# Patient Record
Sex: Male | Born: 1980 | Race: Black or African American | Hispanic: No | Marital: Single | Smoking: Never smoker
Health system: Southern US, Community
[De-identification: ages and names within clinical notes are randomized; demographics above are authoritative.]

## PROBLEM LIST (undated history)

## (undated) ENCOUNTER — Ambulatory Visit (HOSPITAL_COMMUNITY): Discharge status: Absent without leave | Payer: BLUE CROSS/BLUE SHIELD

---

## 2017-06-21 ENCOUNTER — Ambulatory Visit (INDEPENDENT_AMBULATORY_CARE_PROVIDER_SITE_OTHER): Payer: BLUE CROSS/BLUE SHIELD

## 2017-06-21 ENCOUNTER — Ambulatory Visit (HOSPITAL_COMMUNITY)
Admission: EM | Admit: 2017-06-21 | Discharge: 2017-06-21 | Disposition: A | Discharge status: Home / Self Care | Payer: BLUE CROSS/BLUE SHIELD | Attending: Family Medicine | Admitting: Family Medicine

## 2017-06-21 ENCOUNTER — Encounter (HOSPITAL_COMMUNITY): Payer: Self-pay | Admitting: Family Medicine

## 2017-06-21 DIAGNOSIS — Z23 Encounter for immunization: Secondary | ICD-10-CM | POA: Diagnosis not present

## 2017-06-21 DIAGNOSIS — S8392XA Sprain of unspecified site of left knee, initial encounter: Secondary | ICD-10-CM | POA: Diagnosis not present

## 2017-06-21 DIAGNOSIS — S90512A Abrasion, left ankle, initial encounter: Secondary | ICD-10-CM

## 2017-06-21 DIAGNOSIS — S93402A Sprain of unspecified ligament of left ankle, initial encounter: Secondary | ICD-10-CM

## 2017-06-21 DIAGNOSIS — S80212A Abrasion, left knee, initial encounter: Secondary | ICD-10-CM

## 2017-06-21 MED ORDER — NAPROXEN 500 MG PO TABS
500.0000 mg | ORAL_TABLET | Freq: Two times a day (BID) | ORAL | 0 refills | Status: AC
Start: 2017-06-21 — End: ?

## 2017-06-21 MED ORDER — TETANUS-DIPHTH-ACELL PERTUSSIS 5-2.5-18.5 LF-MCG/0.5 IM SUSP
0.5000 mL | Freq: Once | INTRAMUSCULAR | Status: AC
  Administered 2017-06-21: 0.5 mL via INTRAMUSCULAR

## 2017-06-21 MED ORDER — HYDROCODONE-ACETAMINOPHEN 5-325 MG PO TABS: 1.0000 | ORAL_TABLET | Freq: Four times a day (QID) | ORAL | 0 refills | Status: AC | PRN

## 2017-06-21 MED ORDER — TETANUS-DIPHTH-ACELL PERTUSSIS 5-2.5-18.5 LF-MCG/0.5 IM SUSP
INTRAMUSCULAR | Status: AC
  Filled 2017-06-21: qty 0.5
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
## 2017-06-27 ENCOUNTER — Encounter (INDEPENDENT_AMBULATORY_CARE_PROVIDER_SITE_OTHER): Payer: Self-pay | Admitting: Orthopedic Surgery

## 2017-06-27 ENCOUNTER — Ambulatory Visit (INDEPENDENT_AMBULATORY_CARE_PROVIDER_SITE_OTHER): Payer: BLUE CROSS/BLUE SHIELD | Admitting: Orthopedic Surgery

## 2017-06-27 DIAGNOSIS — M79605 Pain in left leg: Secondary | ICD-10-CM

## 2017-06-27 MED ORDER — MELOXICAM 15 MG PO TABS: 15.0000 mg | ORAL_TABLET | Freq: Every day | ORAL | 0 refills | Status: AC

## 2017-06-27 MED ORDER — METHOCARBAMOL 500 MG PO TABS: ORAL_TABLET | ORAL | 0 refills | Status: AC

## 2017-06-30 ENCOUNTER — Encounter (INDEPENDENT_AMBULATORY_CARE_PROVIDER_SITE_OTHER): Payer: Self-pay | Admitting: Orthopedic Surgery
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
## 2017-07-05 ENCOUNTER — Encounter: Payer: Self-pay | Admitting: Physical Therapy

## 2017-07-05 ENCOUNTER — Ambulatory Visit: Payer: BLUE CROSS/BLUE SHIELD | Attending: Orthopedic Surgery | Admitting: Physical Therapy

## 2017-07-05 DIAGNOSIS — M79662 Pain in left lower leg: Secondary | ICD-10-CM

## 2017-07-05 DIAGNOSIS — M25562 Pain in left knee: Secondary | ICD-10-CM | POA: Diagnosis present

## 2017-07-05 DIAGNOSIS — R600 Localized edema: Secondary | ICD-10-CM | POA: Diagnosis present

## 2017-07-05 DIAGNOSIS — M25572 Pain in left ankle and joints of left foot: Secondary | ICD-10-CM
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
## 2017-07-09 ENCOUNTER — Encounter: Payer: Self-pay | Admitting: Physical Therapy

## 2017-07-09 ENCOUNTER — Ambulatory Visit: Payer: BLUE CROSS/BLUE SHIELD | Admitting: Physical Therapy

## 2017-07-09 DIAGNOSIS — M25562 Pain in left knee: Secondary | ICD-10-CM

## 2017-07-09 DIAGNOSIS — M79662 Pain in left lower leg: Secondary | ICD-10-CM

## 2017-07-09 DIAGNOSIS — M25572 Pain in left ankle and joints of left foot: Secondary | ICD-10-CM | POA: Diagnosis not present

## 2017-07-09 DIAGNOSIS — R600 Localized edema: Secondary | ICD-10-CM

## 2017-07-09 NOTE — Therapy (Signed)
[facility] [address] [place], [place], [address] Phone: [phone]   Fax:  [phone]  Physical Therapy Treatment  Patient Details  Name: Kevin BoschRodney Marquez MRN: [id number] Date of Birth: Aug 04, 1980 Referring Provider: Dr. August Saucerean    Encounter Date: 07/09/2017  PT End of Session - 07/09/17 0721    Visit Number  2    Number of Visits  8    Date for PT Re-Evaluation  08/09/17    PT Start Time  0715    PT Stop Time  0808    PT Time Calculation (min)  53 min       History reviewed. No pertinent past medical history.  History reviewed. No pertinent surgical history.  There were no vitals filed for this visit.  Subjective Assessment - 07/09/17 0718    Subjective  Pain is not as bad as it was last time I was here. I think the stretches are helping.     Currently in Pain?  Yes    Pain Score  7  with certain movements    Pain Location  Ankle    Pain Orientation  Left    Pain Descriptors / Indicators  Discomfort;Tender    Pain Frequency  Intermittent    Aggravating Factors   wakes at night, walk a lto     Pain Score  5 up to 5/10 with prolonged activity     Pain Location  Knee    Pain Orientation  Left    Aggravating Factors   bending it wrong, prolonged activity on feet     Pain Relieving Factors  rest, meds, brace                       [facility] Adult PT Treatment/Exercise - 07/09/17 0001      Knee/Hip Exercises: Stretches   Active Hamstring Stretch  3 reps;30 seconds    Quad Stretch  3 reps;30 seconds standing with strap , and prone    Gastroc Stretch  3 reps;30 seconds    Soleus Stretch  3 reps;30 seconds      Knee/Hip Exercises: Aerobic   Nustep  L 5 LE only 5 minutes       Knee/Hip Exercises: Supine   Quad Sets  10 reps    Straight Leg Raises  10 reps    Straight Leg Raise with External Rotation  10 reps      Modalities   Modalities  Cryotherapy;Ultrasound      Cryotherapy   Number Minutes  Cryotherapy  8 Minutes    Cryotherapy Location  Ankle    Type of Cryotherapy  Ice pack      Ultrasound   Ultrasound Location  Left medial ankle     Ultrasound Parameters  1 mhz, 1.0 w/cm2 pulsed    Ultrasound Goals  Pain      Ankle Exercises: Standing   SLS  23 sec     Heel Raises  10 reps    Toe Raise  10 reps                  PT Long Term Goals - 07/05/17 1122      PT LONG TERM GOAL #1   Title  Pt will demo pain free ankle and knee ROM    Time  5    Period  Weeks    Status  New    Target Date  08/09/17      PT LONG  TERM GOAL #2   Title  Pt will be I with HEP for LE stretching and strengthening    Time  5    Target Date  08/09/17      PT LONG TERM GOAL #3   Title  Pt will be able to walk 1 mile without difficulty or pain.     Time  5    Period  Weeks    Status  New    Target Date  08/09/17      PT LONG TERM GOAL #4   Title  Pt will return to work with pain controlled most days of the week (<4/10) with standing activities    Time  5    Period  Weeks    Status  New    Target Date  08/09/17      PT LONG TERM GOAL #5   Title  Pt will score 27% on FOTO or better to demo functional improvement.     Time  5    Period  Weeks    Status  New    Target Date  08/09/17            Plan - 07/09/17 0817    Clinical Impression Statement  Pt arrives with knee and ankle brace. He reports discomfort wearing shoe with ankle ASO, he normally wears flip flops. We removed knee and ankle brace for PT treatment. Began standing ankle work and reviewed HEP, Also began quad/ VMO strengthening. Ankle began to hurt later in treatment after mat exercises no used pulsed ultrasound and ice on ankle.  He reported a decrease in pain afterward.     PT Next Visit Plan  check HEP-(update with standing ankle if he did okay after today) , ROM, begin standing ankle work, bike, manual, [place] and Tape ; assess ultrasound     PT Home Exercise Plan  calf stretching, quad, hamstring      Consulted and Agree with Plan of Care  Patient       Patient will benefit from skilled therapeutic intervention in order to improve the following deficits and impairments:  Increased fascial restricitons, Pain, Decreased mobility, Decreased range of motion, Decreased strength, Increased edema, Impaired flexibility, Difficulty walking, Improper body mechanics  Visit Diagnosis: Pain in left ankle and joints of left foot  Pain in left lower leg  Acute pain of left knee  Localized edema     Problem List There are no active problems to display for this patient.   [name], [place] 07/09/2017, 8:22 AM  [facility] [address] [place], [place], [address] Phone: [phone]   Fax:  [phone]  Name: Kevin Marquez MRN: [id number] Date of Birth: [birth year]

## 2017-07-12 ENCOUNTER — Ambulatory Visit: Payer: BLUE CROSS/BLUE SHIELD

## 2017-07-12 DIAGNOSIS — M25572 Pain in left ankle and joints of left foot: Secondary | ICD-10-CM | POA: Diagnosis not present

## 2017-07-12 DIAGNOSIS — M25562 Pain in left knee: Secondary | ICD-10-CM

## 2017-07-12 DIAGNOSIS — M79662 Pain in left lower leg: Secondary | ICD-10-CM

## 2017-07-12 DIAGNOSIS — R600 Localized edema: Secondary | ICD-10-CM

## 2017-07-12 NOTE — Patient Instructions (Signed)

## 2017-07-12 NOTE — Therapy (Signed)
[facility] [address] [place], [place], [address] Phone: [phone]   Fax:  [phone]  Physical Therapy Treatment  Patient Details  Name: Kevin Marquez MRN: [id number] Date of Birth: [birth year] Referring Provider: Dr. August Saucerean    Encounter Date: 07/12/2017  PT End of Session - 07/12/17 0801    Visit Number  3    Number of Visits  8    Date for PT Re-Evaluation  08/09/17    PT Start Time  0803    PT Stop Time  0850    PT Time Calculation (min)  47 min    Activity Tolerance  Patient tolerated treatment well    Behavior During Therapy  [facility] for tasks assessed/performed       History reviewed. No pertinent past medical history.  History reviewed. No pertinent surgical history.  There were no vitals filed for this visit.  Subjective Assessment - 07/12/17 0806    Subjective  He reports knee alot better 3/10.    Ankle better  4/10.   He is not working.    Currently in Pain?  Yes    Pain Score  4     Pain Location  Ankle    Pain Orientation  Left    Pain Descriptors / Indicators  Tender;Discomfort    Pain Type  Acute pain    Pain Onset  1 to 4 weeks ago    Pain Frequency  Intermittent    Aggravating Factors   movement and bumping ankle    Pain Relieving Factors  meds , rest    Pain Score  3    Pain Location  Knee    Pain Orientation  Left    Pain Descriptors / Indicators  Aching;Sore    Pain Type  Acute pain    Pain Onset  1 to 4 weeks ago    Pain Frequency  Intermittent    Aggravating Factors   on feet , bending    Pain Relieving Factors  rest , meds , brace                       [facility] Adult PT Treatment/Exercise - 07/12/17 0001      Knee/Hip Exercises: Stretches   Passive Hamstring Stretch  Left    Gastroc Stretch  Both;Limitations;60 seconds    Gastroc Stretch Limitations  on tilt board    Soleus Stretch  Left;60 seconds      Knee/Hip Exercises: Aerobic   Nustep  L 5 LE only 5 minutes        Knee/Hip Exercises: Supine   Straight Leg Raises  Left;10 reps;Limitations    Straight Leg Raises Limitations  3 pounds    Straight Leg Raise with External Rotation  Left;10 reps    Patellar Mobs  3 pounds      Knee/Hip Exercises: Sidelying   Hip ABduction  Left;10 reps      Knee/Hip Exercises: Prone   Hamstring Curl  Limitations    Hamstring Curl Limitations  25 reps 3 pounds    Straight Leg Raises  Left;10 reps;Limitations 3 pounds      Modalities   Modalities  Iontophoresis      Ultrasound   Ultrasound Location  tl medial ankle     Ultrasound Parameters  1 MHz 1 Wcm2    Ultrasound Goals  Pain      Iontophoresis   Type of Iontophoresis  Dexamethasone    Location  medical  Dose  1cc    Time  4-6 hours      Ankle Exercises: Standing   SLS  worked on leg sweeps in arcs RT and LT     Heel Raises  20 reps    Toe Raise  20 reps             PT Education - 07/12/17 0844    Education provided  Yes    Education Details  ionto patch management    Person(s) Educated  Patient    Methods  Explanation    Comprehension  Verbalized understanding          PT Long Term Goals - 07/05/17 1122      PT LONG TERM GOAL #1   Title  Pt will demo pain free ankle and knee ROM    Time  5    Period  Weeks    Status  New    Target Date  08/09/17      PT LONG TERM GOAL #2   Title  Pt will be I with HEP for LE stretching and strengthening    Time  5    Target Date  08/09/17      PT LONG TERM GOAL #3   Title  Pt will be able to walk 1 mile without difficulty or pain.     Time  5    Period  Weeks    Status  New    Target Date  08/09/17      PT LONG TERM GOAL #4   Title  Pt will return to work with pain controlled most days of the week (<4/10) with standing activities    Time  5    Period  Weeks    Status  New    Target Date  08/09/17      PT LONG TERM GOAL #5   Title  Pt will score 27% on FOTO or better to demo functional improvement.     Time  5    Period   Weeks    Status  New    Target Date  08/09/17            Plan - 07/12/17 0802    Clinical Impression Statement  He reports improving and getting better by day per pt.  Still tender medial knee and ankle.  Really emphasized need to remove patch if skin irritated    PT Treatment/Interventions  ADLs/Self Care Home Management;Electrical Stimulation;Therapeutic exercise;Taping;Iontophoresis 4mg /ml Dexamethasone;Cryotherapy;Ultrasound;Functional mobility training;Manual techniques;Neuromuscular re-education;Passive range of motion;Patient/family education;Therapeutic activities    PT Next Visit Plan  check HEP-(update with standing ankle if he did okay after today) , ROM, begin standing ankle work, bike, manual, [place] and Tape ; assess ionto    PT Home Exercise Plan  calf stretching, quad, hamstring     Consulted and Agree with Plan of Care  Patient       Patient will benefit from skilled therapeutic intervention in order to improve the following deficits and impairments:  Increased fascial restricitons, Pain, Decreased mobility, Decreased range of motion, Decreased strength, Increased edema, Impaired flexibility, Difficulty walking, Improper body mechanics  Visit Diagnosis: Pain in left ankle and joints of left foot  Pain in left lower leg  Acute pain of left knee  Localized edema     Problem List There are no active problems to display for this patient.   Caprice Red  PT 07/12/2017, 8:50 AM  [facility] [address]  [address] [place], [place], [address] Phone: [phone]   Fax:  [phone]  Name: Kevin Marquez MRN: [id number] Date of Birth: 1980-12-08

## 2017-07-17 ENCOUNTER — Ambulatory Visit: Payer: BLUE CROSS/BLUE SHIELD | Admitting: Physical Therapy

## 2017-07-17 DIAGNOSIS — M25562 Pain in left knee: Secondary | ICD-10-CM

## 2017-07-17 DIAGNOSIS — M79662 Pain in left lower leg: Secondary | ICD-10-CM

## 2017-07-17 DIAGNOSIS — M25572 Pain in left ankle and joints of left foot: Secondary | ICD-10-CM

## 2017-07-17 DIAGNOSIS — R600 Localized edema: Secondary | ICD-10-CM

## 2017-07-17 NOTE — Therapy (Signed)
[place], [place], [address] Phone: [phone]   Fax:  [phone]  Physical Therapy Treatment  Patient Details  Name: Kevin Marquez MRN: [id number] Date of Birth: 09-01-1980 Referring Provider: Dr. Marlou Sa    Encounter Date: 07/17/2017  PT End of Session - 07/17/17 1107    Visit Number  4    Number of Visits  8    Date for PT Re-Evaluation  08/09/17    PT Start Time  1100    PT Stop Time  1143    PT Time Calculation (min)  43 min       No past medical history on file.  No past surgical history on file.  There were no vitals filed for this visit.  Subjective Assessment - 07/17/17 1105    Subjective  Feeling better every day. knee 2/10 medial knee soreness. Ankle feels good. 0/10.    Currently in Pain?  Yes    Pain Score  2     Pain Location  Knee    Pain Orientation  Left;Medial    Pain Descriptors / Indicators  Sore    Aggravating Factors   stretches, move too fast or a certain way.     Pain Relieving Factors  meds, rest         [facility] PT Assessment - 07/17/17 0001      Observation/Other Assessments   Focus on Therapeutic Outcomes (FOTO)   27% limitation improved from 48% limitation      AROM   Right Knee Extension  0    Right Knee Flexion  136    Left Knee Extension  0    Left Knee Flexion  134    Left Ankle Dorsiflexion  10    Left Ankle Inversion  30    Left Ankle Eversion  20      Strength   Left Knee Flexion  5/5    Left Knee Extension  5/5                   [facility] Adult PT Treatment/Exercise - 07/17/17 0001      Knee/Hip Exercises: Stretches   Gastroc Stretch  Both;Limitations;60 seconds    Gastroc Stretch Limitations  on tilt board    Soleus Stretch  Left;60 seconds      Knee/Hip Exercises: Aerobic   Recumbent Bike  L2 x 7 min      Ultrasound   Ultrasound Location  Lt medial knee     Ultrasound Parameters  ! mhz 1 w/cm2 50%    Ultrasound Goals  Pain       Ankle Exercises: Standing   SLS  60 sec     Rebounder  SLS left red ball 7 toss best- slight knee flexion     Heel Raises  20 reps    Toe Raise  20 reps                  PT Long Term Goals - 07/17/17 1133      PT LONG TERM GOAL #1   Title  Pt will demo pain free ankle and knee ROM    Time  5    Period  Weeks    Status  Achieved      PT LONG TERM GOAL #2   Title  Pt will be I with HEP for LE stretching and strengthening    Time  5    Period  Weeks  Status  On-going      PT LONG TERM GOAL #3   Title  Pt will be able to walk 1 mile without difficulty or pain.     Baseline  No pain with daily activities     Time  5    Period  Weeks    Status  Unable to assess      PT LONG TERM GOAL #4   Title  Pt will return to work with pain controlled most days of the week (<4/10) with standing activities    Baseline  has not returned to work     Period  Weeks    Status  On-going      PT LONG TERM GOAL #5   Title  Pt will score 27% on FOTO or better to demo functional improvement.     Time  5    Period  Weeks    Status  Achieved            Plan - 07/17/17 1158    Clinical Impression Statement  Steady improvement in pain per pt. No ankle pain, and mild knee pain throughout day. He has not returned to walking other than community ambulation. He needs to lift up to  80 lbs to RTW. He will see MD tomorrow and may be released. Will begin lifting kext visit. FOTO score much improved. LTG# 5 MET. Ankle and knee ROM /strength improved.     PT Next Visit Plan  finalize HEP and issue if next visit is his last day, what did MD say. Reecheck ankle strength, work toward completion of goals.     PT Home Exercise Plan  calf stretching, quad, hamstring     Consulted and Agree with Plan of Care  Patient       Patient will benefit from skilled therapeutic intervention in order to improve the following deficits and impairments:  Increased fascial restricitons, Pain, Decreased  mobility, Decreased range of motion, Decreased strength, Increased edema, Impaired flexibility, Difficulty walking, Improper body mechanics  Visit Diagnosis: Pain in left ankle and joints of left foot  Pain in left lower leg  Acute pain of left knee  Localized edema     Problem List There are no active problems to display for this patient.   Dorene Ar, [place] 07/17/2017, 12:08 PM  [facility] [address] [place], [place], [address] Phone: [phone]   Fax:  [phone]  Name: Terrin Meddaugh MRN: [id number] Date of Birth: 09/15/1980

## 2017-07-18 ENCOUNTER — Ambulatory Visit (INDEPENDENT_AMBULATORY_CARE_PROVIDER_SITE_OTHER): Payer: BLUE CROSS/BLUE SHIELD | Admitting: Orthopedic Surgery

## 2017-07-18 ENCOUNTER — Encounter (INDEPENDENT_AMBULATORY_CARE_PROVIDER_SITE_OTHER): Payer: Self-pay | Admitting: Orthopedic Surgery

## 2017-07-18 DIAGNOSIS — M79605 Pain in left leg: Secondary | ICD-10-CM | POA: Diagnosis not present

## 2017-07-19 ENCOUNTER — Encounter: Payer: Self-pay | Admitting: Physical Therapy

## 2017-07-19 ENCOUNTER — Ambulatory Visit: Payer: BLUE CROSS/BLUE SHIELD | Admitting: Physical Therapy

## 2017-07-19 DIAGNOSIS — M25562 Pain in left knee: Secondary | ICD-10-CM

## 2017-07-19 DIAGNOSIS — M25572 Pain in left ankle and joints of left foot: Secondary | ICD-10-CM

## 2017-07-19 DIAGNOSIS — R600 Localized edema: Secondary | ICD-10-CM

## 2017-07-19 DIAGNOSIS — M79662 Pain in left lower leg: Secondary | ICD-10-CM

## 2017-07-19 NOTE — Therapy (Signed)
[facility] [address] [place], [place], [address] Phone: [phone]   Fax:  [phone]  Physical Therapy Treatment  Patient Details  Name: Kevin BoschRodney Marquez MRN: [id number] Date of Birth: 21-Dec-1980 Referring Provider: Dr. August Saucerean    Encounter Date: 07/19/2017  PT End of Session - 07/19/17 0807    Visit Number  5    Number of Visits  8    Date for PT Re-Evaluation  08/09/17    PT Start Time  0801    PT Stop Time  0843    PT Time Calculation (min)  42 min       History reviewed. No pertinent past medical history.  History reviewed. No pertinent surgical history.  There were no vitals filed for this visit.  Subjective Assessment - 07/19/17 0804    Subjective  2/10 tenderness in ankle and knee after walking almost 2 miles yesterday. MD released me to return to work on April 24th. He recommended 2 more PT treatments.     Currently in Pain?  Yes    Pain Score  2     Pain Location  Knee    Pain Orientation  Left;Medial    Aggravating Factors   prolonged walking    Pain Relieving Factors  meds, rest, ultrasound    Pain Score  2    Pain Location  Ankle    Pain Orientation  Left    Pain Descriptors / Indicators  Tender    Aggravating Factors   prolonged walking    Pain Relieving Factors  rest, meds                        [facility] Adult PT Treatment/Exercise - 07/19/17 0001      Therapeutic Activites    Therapeutic Activities  Lifting    Lifting  55# lift knee to waist x 5, Spent time with hip hinge/ sit to stand maintaining neutral spine. He required max cues, demo and multiple repetitions.       Knee/Hip Exercises: Aerobic   Recumbent Bike  L2 x 5 min      Knee/Hip Exercises: Machines for Strengthening   Cybex Leg Press  Left one plate x 20       Ultrasound   Ultrasound Location  left medial knee    Ultrasound Parameters  1 mhz, 1.2 w/cm2 pulsed     Ultrasound Goals  Pain      Ankle Exercises:  Standing   SLS  on foam 6 sec    Rebounder  SLS on foam pad  left red ball 7 toss best- slight knee flexion                   PT Long Term Goals - 07/17/17 1133      PT LONG TERM GOAL #1   Title  Pt will demo pain free ankle and knee ROM    Time  5    Period  Weeks    Status  Achieved      PT LONG TERM GOAL #2   Title  Pt will be I with HEP for LE stretching and strengthening    Time  5    Period  Weeks    Status  On-going      PT LONG TERM GOAL #3   Title  Pt will be able to walk 1 mile without difficulty or pain.     Baseline  No pain with daily  activities     Time  5    Period  Weeks    Status  Unable to assess      PT LONG TERM GOAL #4   Title  Pt will return to work with pain controlled most days of the week (<4/10) with standing activities    Baseline  has not returned to work     Period  Weeks    Status  On-going      PT LONG TERM GOAL #5   Title  Pt will score 27% on FOTO or better to demo functional improvement.     Time  5    Period  Weeks    Status  Achieved            Plan - 07/19/17 0901    Clinical Impression Statement  Saw MD yesterday. RTW on April 24th. Walked 2 miles yesterday to check tolerance and he had no increased pain during the walk. Today he reports 2/10 tenderness in the ankle and knee. Increased SLS challenge to foam pad. Began lifting education which he has much difficulty with hip hinge and tend to flex at lumbar spine. Repeated US to knee as he thinks this is most helpful. He will have one more visit and discharge/RTW.     PT Next Visit Plan  finalize, HEP, discharge. FOTO done 4/16; review lifting    PT Home Exercise Plan  calf stretching, quad, hamstring     Consulted and Agree with Plan of Care  Patient       Patient will benefit from skilled therapeutic intervention in order to improve the following deficits and impairments:  Increased fascial restricitons, Pain, Decreased mobility, Decreased range of motion,  Decreased strength, Increased edema, Impaired flexibility, Difficulty walking, Improper body mechanics  Visit Diagnosis: Pain in left ankle and joints of left foot  Pain in left lower leg  Acute pain of left knee  Localized edema     Problem List There are no active problems to display for this patient.   [name], [place] 07/19/2017, 9:06 AM  [facility] [address] [place], [place], [address] Phone: [phone]   Fax:  [phone]  Name: Kevin Marquez MRN: [id number] Date of Birth: 01-20-1981

## 2017-07-21 ENCOUNTER — Encounter (INDEPENDENT_AMBULATORY_CARE_PROVIDER_SITE_OTHER): Payer: Self-pay | Admitting: Orthopedic Surgery

## 2017-07-21 NOTE — Progress Notes (Signed)
   Office Visit Note   Patient: Braulio BoschRodney Fenner           Date of Birth: 1980-05-01           MRN: [id number] Visit Date: 07/18/2017 Requested by: No referring provider defined for this encounter. PCP: Patient, No Pcp Per  Subjective: Chief Complaint  Patient presents with  . Left Knee - Follow-up  . Left Ankle - Follow-up    HPI: Alycia RossettiRyan is a patient here for 3-week follow-up of left knee.  States he is doing much better.  He has been going to physical therapy.  He has got a lot of relief from physical therapy.  He is able to walk without the brace.  Questionable MCL injury last clinic visit.  They have been doing ultrasound to the knee to help.  At his work he has to lift up to 80 pounds.  He did walk a mile today.              ROS: All systems reviewed are negative as they relate to the chief complaint within the history of present illness.  Patient denies  fevers or chills.   Assessment & Plan: Visit Diagnoses:  1. Pain in left leg     Plan: Impression is left knee pain improved with physical therapy.  Plan is to let him return to work next Wednesday.  I want him to finish physical therapy.  I think he should be good to go based on how he is walking today in his examination.  Follow-up as needed.  Follow-Up Instructions: Return if symptoms worsen or fail to improve.   Orders:  No orders of the defined types were placed in this encounter.  No orders of the defined types were placed in this encounter.     Procedures: No procedures performed   Clinical Data: No additional findings.  Objective: Vital Signs: There were no vitals taken for this visit.  Physical Exam:   Constitutional: Patient appears well-developed HEENT:  Head: Normocephalic Eyes:EOM are normal Neck: Normal range of motion Cardiovascular: Normal rate Pulmonary/chest: Effort normal Neurologic: Patient is alert Skin: Skin is warm Psychiatric: Patient has normal mood and affect  Orthopedic exam  demonstrates full active and passive range of motion of the knee.  No medial sided tenderness.  Collateral cruciate ligaments are stable.  Extensor mechanism is intact.  No groin pain with internal/external rotation of the leg.  No other masses lymph adenopathy or skin changes noted in that left foot and ankle or knee region.  Ortho Exam:   Constitutional: Patient appears well-developed HEENT:  Head: Normocephalic Eyes:EOM are normal Neck: Normal range of motion Cardiovascular: Normal rate Pulmonary/chest: Effort normal Neurologic: Patient is alert Skin: Skin is warm Psychiatric: Patient has normal mood and affect    Specialty Comments:  No specialty comments available.  Imaging: No results found.   PMFS History: There are no active problems to display for this patient.  History reviewed. No pertinent past medical history.  History reviewed. No pertinent family history.  History reviewed. No pertinent surgical history. Social History   Occupational History  . Not on file  Tobacco Use  . Smoking status: Never Smoker  . Smokeless tobacco: Never Used  Substance and Sexual Activity  . Alcohol use: Not on file  . Drug use: Not on file  . Sexual activity: Not on file

## 2017-07-23 ENCOUNTER — Ambulatory Visit: Payer: BLUE CROSS/BLUE SHIELD

## 2017-07-23 DIAGNOSIS — M79662 Pain in left lower leg: Secondary | ICD-10-CM

## 2017-07-23 DIAGNOSIS — R600 Localized edema: Secondary | ICD-10-CM

## 2017-07-23 DIAGNOSIS — M25572 Pain in left ankle and joints of left foot: Secondary | ICD-10-CM | POA: Diagnosis not present

## 2017-07-23 DIAGNOSIS — M25562 Pain in left knee: Secondary | ICD-10-CM

## 2017-07-23 NOTE — Therapy (Signed)
[place], [place], [address] Phone: [phone]   Fax:  [phone]  Physical Therapy Treatment/Discharge  Patient Details  Name: Kevin Marquez MRN: [id number] Date of Birth: February 05, 1981 Referring Provider: Dr. Marlou Sa    Encounter Date: 07/23/2017  PT End of Session - 07/23/17 0852    Visit Number  6    Number of Visits  8    Date for PT Re-Evaluation  08/09/17    PT Start Time  0848       History reviewed. No pertinent past medical history.  History reviewed. No pertinent surgical history.  There were no vitals filed for this visit.  Subjective Assessment - 07/23/17 0854    Subjective  No pain but still tender to touch medial LT knee .  Pinching feeling at 1-2 level. Discharge today    Currently in Pain?  No/denies          Ther Exer REviewed HEP and he is able to do the stretching correctly Exer bike for 7 min warm up .    Ther Activity: Worked on lifting technique and he was able to handle  80 pounds though he reported it was not easy.  He will have to lift occasionally 80 pounds at work and lighter weights frequently.  .                        PT Long Term Goals - 07/23/17 0350      PT LONG TERM GOAL #1   Title  Pt will demo pain free ankle and knee ROM    Baseline  Very  mild with full flexion ( 1-2/10) of knee , no pain incr with extension    Status  Partially Met      PT LONG TERM GOAL #2   Title  Pt will be I with HEP for LE stretching and strengthening    Status  Achieved      PT LONG TERM GOAL #3   Title  Pt will be able to walk 1 mile without difficulty or pain.     Baseline  2 miles recent. sore but did again next day and ok post    Status  Achieved      PT LONG TERM GOAL #4   Title  Pt will return to work with pain controlled most days of the week (<4/10) with standing activities    Status  Unable to assess      PT LONG TERM GOAL #5   Title  Pt will  score 27% on FOTO or better to demo functional improvement.     Status  Achieved            Plan - 07/23/17 0856    Clinical Impression Statement  Pt reports ready fpor discharge.  Generally no pain but still tender and gets pinching sensation. He wa able to manage 80 pounds lifting but with some medial LT knee pain. He was better with dead lift posture rather than full squat which was much mor painful/.   He was advised to ice and medicate as MD allowes.  He will return to MD if needed    PT Treatment/Interventions  ADLs/Self Care Home Management;Electrical Stimulation;Therapeutic exercise;Taping;Iontophoresis 75m/ml Dexamethasone;Cryotherapy;Ultrasound;Functional mobility training;Manual techniques;Neuromuscular re-education;Passive range of motion;Patient/family education;Therapeutic activities    PT Next Visit Plan  Discharge with HEP.     PT Home Exercise Plan  calf stretching, quad, hamstring  Consulted and Agree with Plan of Care  Patient       Patient will benefit from skilled therapeutic intervention in order to improve the following deficits and impairments:  Increased fascial restricitons, Pain, Decreased mobility, Decreased range of motion, Decreased strength, Increased edema, Impaired flexibility, Difficulty walking, Improper body mechanics  Visit Diagnosis: Pain in left ankle and joints of left foot  Pain in left lower leg  Acute pain of left knee  Localized edema     Problem List There are no active problems to display for this patient.   Darrel Hoover PT 07/23/2017, 9:29 AM  [facility] [address] [place], [place], [address] Phone: [phone]   Fax:  [phone]  Name: Kevin Marquez MRN: [id number] Date of Birth: [birth year]  PHYSICAL THERAPY DISCHARGE SUMMARY  Visits from Start of Care: 6  Current functional level related to goals / functional outcomes: See above   Remaining  deficits: See above   Education / Equipment: HEP an dlifting for work Plan: Patient agrees to discharge.  Patient goals were partially met. Patient is being discharged due to meeting the stated rehab goals.  ?????

## 2017-07-27 ENCOUNTER — Encounter: Payer: BLUE CROSS/BLUE SHIELD | Admitting: Physical Therapy
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
